# Patient Record
Sex: Female | Born: 1950 | Race: White | Hispanic: No | Marital: Married | Smoking: Never smoker
Health system: Southern US, Community
[De-identification: ages and names within clinical notes are randomized; demographics above are authoritative.]

## PROBLEM LIST (undated history)

## (undated) DIAGNOSIS — I10 Essential (primary) hypertension: Secondary | ICD-10-CM

## (undated) HISTORY — PX: OVARIAN CYST SURGERY: SHX726

---

## 2016-03-10 ENCOUNTER — Emergency Department (HOSPITAL_BASED_OUTPATIENT_CLINIC_OR_DEPARTMENT_OTHER): Payer: BLUE CROSS/BLUE SHIELD

## 2016-03-10 ENCOUNTER — Encounter (HOSPITAL_BASED_OUTPATIENT_CLINIC_OR_DEPARTMENT_OTHER): Payer: Self-pay

## 2016-03-10 ENCOUNTER — Emergency Department (HOSPITAL_BASED_OUTPATIENT_CLINIC_OR_DEPARTMENT_OTHER)
Admission: EM | Admit: 2016-03-10 | Discharge: 2016-03-10 | Disposition: A | Discharge status: Home / Self Care | Payer: BLUE CROSS/BLUE SHIELD | Attending: Emergency Medicine | Admitting: Emergency Medicine

## 2016-03-10 DIAGNOSIS — Z79899 Other long term (current) drug therapy: Secondary | ICD-10-CM | POA: Insufficient documentation

## 2016-03-10 DIAGNOSIS — R112 Nausea with vomiting, unspecified: Secondary | ICD-10-CM | POA: Diagnosis not present

## 2016-03-10 DIAGNOSIS — I10 Essential (primary) hypertension: Secondary | ICD-10-CM | POA: Insufficient documentation

## 2016-03-10 DIAGNOSIS — R1031 Right lower quadrant pain: Secondary | ICD-10-CM | POA: Insufficient documentation

## 2016-03-10 HISTORY — DX: Essential (primary) hypertension: I10

## 2016-03-10 LAB — COMPREHENSIVE METABOLIC PANEL
ALK PHOS: 59 U/L (ref 38–126)
ALT: 28 U/L (ref 14–54)
AST: 32 U/L (ref 15–41)
Albumin: 4.8 g/dL (ref 3.5–5.0)
Anion gap: 11 (ref 5–15)
BILIRUBIN TOTAL: 0.8 mg/dL (ref 0.3–1.2)
BUN: 10 mg/dL (ref 6–20)
CALCIUM: 9.7 mg/dL (ref 8.9–10.3)
CO2: 27 mmol/L (ref 22–32)
CREATININE: 0.81 mg/dL (ref 0.44–1.00)
Chloride: 100 mmol/L — ABNORMAL LOW (ref 101–111)
GFR calc non Af Amer: >60 mL/min (ref >60)
Glucose, Bld: 121 mg/dL — ABNORMAL HIGH (ref 65–99)
Potassium: 3.5 mmol/L (ref 3.5–5.1)
SODIUM: 138 mmol/L (ref 135–145)
TOTAL PROTEIN: 7.8 g/dL (ref 6.5–8.1)

## 2016-03-10 LAB — CBC WITH DIFFERENTIAL/PLATELET
BASOS PCT: 0 %
Basophils Absolute: 0 10*3/uL (ref 0.0–0.1)
EOS ABS: 0.2 10*3/uL (ref 0.0–0.7)
EOS PCT: 2 %
HCT: 43 % (ref 36.0–46.0)
Hemoglobin: 14.2 g/dL (ref 12.0–15.0)
LYMPHS ABS: 2.2 10*3/uL (ref 0.7–4.0)
Lymphocytes Relative: 24 %
MCH: 29.8 pg (ref 26.0–34.0)
MCHC: 33 g/dL (ref 30.0–36.0)
MCV: 90.1 fL (ref 78.0–100.0)
MONOS PCT: 7 %
Monocytes Absolute: 0.7 10*3/uL (ref 0.1–1.0)
Neutro Abs: 6 10*3/uL (ref 1.7–7.7)
Neutrophils Relative %: 67 %
PLATELETS: 221 10*3/uL (ref 150–400)
RBC: 4.77 MIL/uL (ref 3.87–5.11)
RDW: 14.4 % (ref 11.5–15.5)
WBC: 9.1 10*3/uL (ref 4.0–10.5)

## 2016-03-10 LAB — URINALYSIS, ROUTINE W REFLEX MICROSCOPIC
Bilirubin Urine: NEGATIVE
Glucose, UA: NEGATIVE mg/dL
Ketones, ur: NEGATIVE mg/dL
Leukocytes, UA: NEGATIVE
Nitrite: NEGATIVE
Protein, ur: NEGATIVE mg/dL
Specific Gravity, Urine: 1.004 — ABNORMAL LOW (ref 1.005–1.030)
pH: 6.5 (ref 5.0–8.0)

## 2016-03-10 LAB — URINE MICROSCOPIC-ADD ON

## 2016-03-10 LAB — LIPASE, BLOOD: Lipase: 22 U/L (ref 11–51)

## 2016-03-10 MED ORDER — GI COCKTAIL ~~LOC~~
30.0000 mL | Freq: Once | ORAL | Status: AC
  Administered 2016-03-10: 30 mL via ORAL
  Filled 2016-03-10: qty 30

## 2016-03-10 MED ORDER — ONDANSETRON HCL 4 MG PO TABS: 4.0000 mg | ORAL_TABLET | Freq: Four times a day (QID) | ORAL | 0 refills | Status: AC

## 2016-03-10 MED ORDER — DICYCLOMINE HCL 10 MG PO CAPS
10.0000 mg | ORAL_CAPSULE | Freq: Once | ORAL | Status: AC
Start: 2016-03-10 — End: 2016-03-10
  Administered 2016-03-10: 10 mg via ORAL
  Filled 2016-03-10: qty 1

## 2016-03-10 NOTE — ED Notes (Signed)
ED Provider at bedside. 

## 2016-03-10 NOTE — Discharge Instructions (Signed)

## 2016-03-10 NOTE — ED Provider Notes (Signed)
Emergency Department Provider Note  By signing my name below, I, Linna DarnerRussell Turner, attest that this documentation has been prepared under the direction and in the presence of physician practitioner, Maia PlanJoshua G Livianna Petraglia, MD. Electronically Signed: Linna Darnerussell Turner, Scribe. 03/10/2016. 10:12 PM.  I have reviewed the triage vital signs and the nursing notes.   HISTORY  Chief Complaint Abdominal Pain   HPI Comments: Belinda Mckinney is a 65 y.o. female who presents to the Emergency Department complaining of sudden onset, constant, waxing and waning, RLQ abdominal pain beginning last night. Pt describes her pain as a pressure-like sensation and notes it radiates across her lower abdomen. She states she was vomiting PTA due to the severity of her abdominal pain. She states the pain has "eased off" and is not unbearable currently. Pt reports she has not been eating as much since onset as a result of her abdominal pain. She states she has been passing gas and having normal bowel movements. She denies h/o abdominal surgery or kidney stone. 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 She notes she has a hiatal hernia. She denies CP, diarrhea, hematuria, or any other associated symptoms.   Past Medical History:  Diagnosis Date  . Hypertension     There are no active problems to display for this patient.   Past Surgical History:  Procedure Laterality Date  . OVARIAN CYST SURGERY      Current Outpatient Rx  . Order #: [id number] Class: Historical Med  . Order #: [id number] Class: Historical Med  . Order #: [id number] Class: Print    Allergies Patient has no known allergies.  No family history on file.  Social History Social History  Substance Use Topics  . Smoking status: Never Smoker  . Smokeless tobacco: Never Used  . Alcohol use No    Review of Systems  Constitutional: No  fever/chills Eyes: No visual changes. ENT: No sore throat. Cardiovascular: Denies chest pain. Respiratory: Denies shortness of breath. Gastrointestinal: Positive right sided lower abdominal pain. Positive nausea and vomiting. No diarrhea.  No constipation. Genitourinary: Negative for dysuria. Negative for hematuria.  Musculoskeletal: Negative for back pain. Skin: Negative for rash. Neurological: Negative for headaches, focal weakness or numbness.  10-point ROS otherwise negative.  ____________________________________________   PHYSICAL EXAM:  VITAL SIGNS: ED Triage Vitals  Enc Vitals Group     BP 03/10/16 1915 (!) 146/102     Pulse Rate 03/10/16 1915 88     Resp 03/10/16 1915 18     Temp 03/10/16 1915 97.7 F (36.5 C)     Temp Source 03/10/16 1915 Oral     SpO2 03/10/16 1915 100 %     Weight 03/10/16 1916 213 lb (96.6 kg)     Height 03/10/16 1916 5\' 6"  (1.676 m)     Pain Score 03/10/16 1913 7   Constitutional: Alert and oriented. Well appearing and in no acute distress. Eyes: Conjunctivae are normal.  Head: Atraumatic. Nose: No congestion/rhinnorhea. Mouth/Throat: Mucous membranes are moist.  Oropharynx non-erythematous. Neck: No stridor.  Cardiovascular: Normal rate, regular rhythm. Good peripheral circulation. Grossly normal heart sounds.   Respiratory: Normal respiratory effort.  No retractions. Lungs CTAB. Gastrointestinal: Soft with minimal tenderness to palpation along the RLQ. No distention.  Musculoskeletal: No lower extremity tenderness nor edema. No gross deformities of extremities. Neurologic:  Normal speech and language. No gross focal neurologic deficits are appreciated.  Skin:  Skin is warm, dry and intact. No rash noted.  ____________________________________________   LABS (all labs ordered are listed, but only abnormal results are displayed)  Labs Reviewed  URINALYSIS, ROUTINE W REFLEX MICROSCOPIC (NOT AT [facility]) - Abnormal; Notable for the following:        Result Value   Specific Gravity, Urine 1.004 (*)    Hgb urine dipstick LARGE (*)    All other components within normal limits  URINE MICROSCOPIC-ADD ON - Abnormal; Notable for the following:    Squamous Epithelial / LPF 0-5 (*)    Bacteria, UA RARE (*)    All other components within normal limits  COMPREHENSIVE METABOLIC PANEL - Abnormal; Notable for the following:    Chloride 100 (*)    Glucose, Bld 121 (*)    All other components within normal limits  LIPASE, BLOOD  CBC WITH DIFFERENTIAL/PLATELET   ____________________________________________  RADIOLOGY  Dg Abdomen Acute W/chest  Result Date: 03/10/2016 CLINICAL DATA:  RIGHT lower quadrant pressure.  Nausea and vomiting. EXAM: DG ABDOMEN ACUTE W/ 1V CHEST COMPARISON:  None. FINDINGS: Cardiomediastinal silhouette is normal. Lungs are clear, no pleural effusions. No pneumothorax. Soft tissue planes and included osseous structures are unremarkable. Bowel gas pattern is nondilated and nonobstructive. Single large bowel air-fluid level in the pelvis. No intra-abdominal mass effect, pathologic calcifications or free air. Soft tissue planes and included osseous structures are non-suspicious. Mild broad thoracolumbar dextroscoliosis on this nonweightbearing examination. IMPRESSION: No acute cardiopulmonary process. Single colonic air-fluid level, possible enteritis. Nonobstructive bowel gas pattern. Electronically Signed   By: Awilda Metro M.D.   On: 03/10/2016 22:44    ____________________________________________ DIAGNOSTIC STUDIES: Oxygen Saturation is 98% on RA, normal by my interpretation.    COORDINATION OF CARE: 10:17 PM Discussed treatment plan with pt at bedside and pt agreed to plan.  PROCEDURES  Procedure(s) performed:   Procedures  None ____________________________________________   INITIAL IMPRESSION / ASSESSMENT AND PLAN / ED COURSE  Pertinent labs & imaging results that were available during my care of  the patient were reviewed by me and considered in my medical decision making (see chart for details).  Patient with intermittent RLQ abdominal pain and bloating. Abdomen is minimally tender and improved slightly with passing gas. Continues to have BM. Lower clinical suspicion for appendicitis with minimal tenderness on exam. Will obtain plain film to assess bowel gas pattern. No report of blood in stool or fever to suggest infectious enteritis.   Labs unremarkable. Findings on plain from correlated well. Plan for conservative mgmt at home. No indication for abx. Will discharge with strong return precautions.   At this time, I do not feel there is any life-threatening condition present. I have reviewed and discussed all results (EKG, imaging, lab, urine as appropriate), exam findings with patient. I have reviewed nursing notes and appropriate previous records.  I feel the patient is safe to be discharged home without further emergent workup. Discussed usual and customary return precautions. Patient and family (if present) verbalize understanding and are comfortable with this plan.  Patient will follow-up with their primary care provider. If they do not have a primary care provider, information for follow-up has been provided to them. All questions have been answered.   ____________________________________________  FINAL CLINICAL IMPRESSION(S) / ED DIAGNOSES  Final diagnoses:  Right lower quadrant abdominal pain     MEDICATIONS GIVEN DURING THIS VISIT:  Medications  dicyclomine (BENTYL) capsule 10 mg (10 mg Oral Given 03/10/16 2221)  gi cocktail (Maalox,Lidocaine,Donnatal) (30 mLs  Oral Given 03/10/16 2221)     NEW OUTPATIENT MEDICATIONS STARTED DURING THIS VISIT:  Discharge Medication List as of 03/10/2016 11:08 PM    START taking these medications   Details  ondansetron (ZOFRAN) 4 MG tablet Take 1 tablet (4 mg total) by mouth every 6 (six) hours., Starting Wed 03/10/2016, Print         Note:  This document was prepared using Dragon voice recognition software and may include unintentional dictation errors.  Alona BeneJoshua Yasmyn Bellisario, MD Emergency Medicine  I personally performed the services described in this documentation, which was scribed in my presence. The recorded information has been reviewed and is accurate.       Maia PlanJoshua G Bowie Doiron, MD 03/11/16 [id number]

## 2016-03-10 NOTE — ED Notes (Signed)
Patient transported to X-ray 

## 2017-11-14 IMAGING — DX DG ABDOMEN ACUTE W/ 1V CHEST
3 series · 3 of 3 positions shown · non-contrast
Comparison: None.

CLINICAL DATA: RIGHT lower quadrant pressure.  Nausea and vomiting.

EXAM:
DG ABDOMEN ACUTE W/ 1V CHEST

[chest pa]
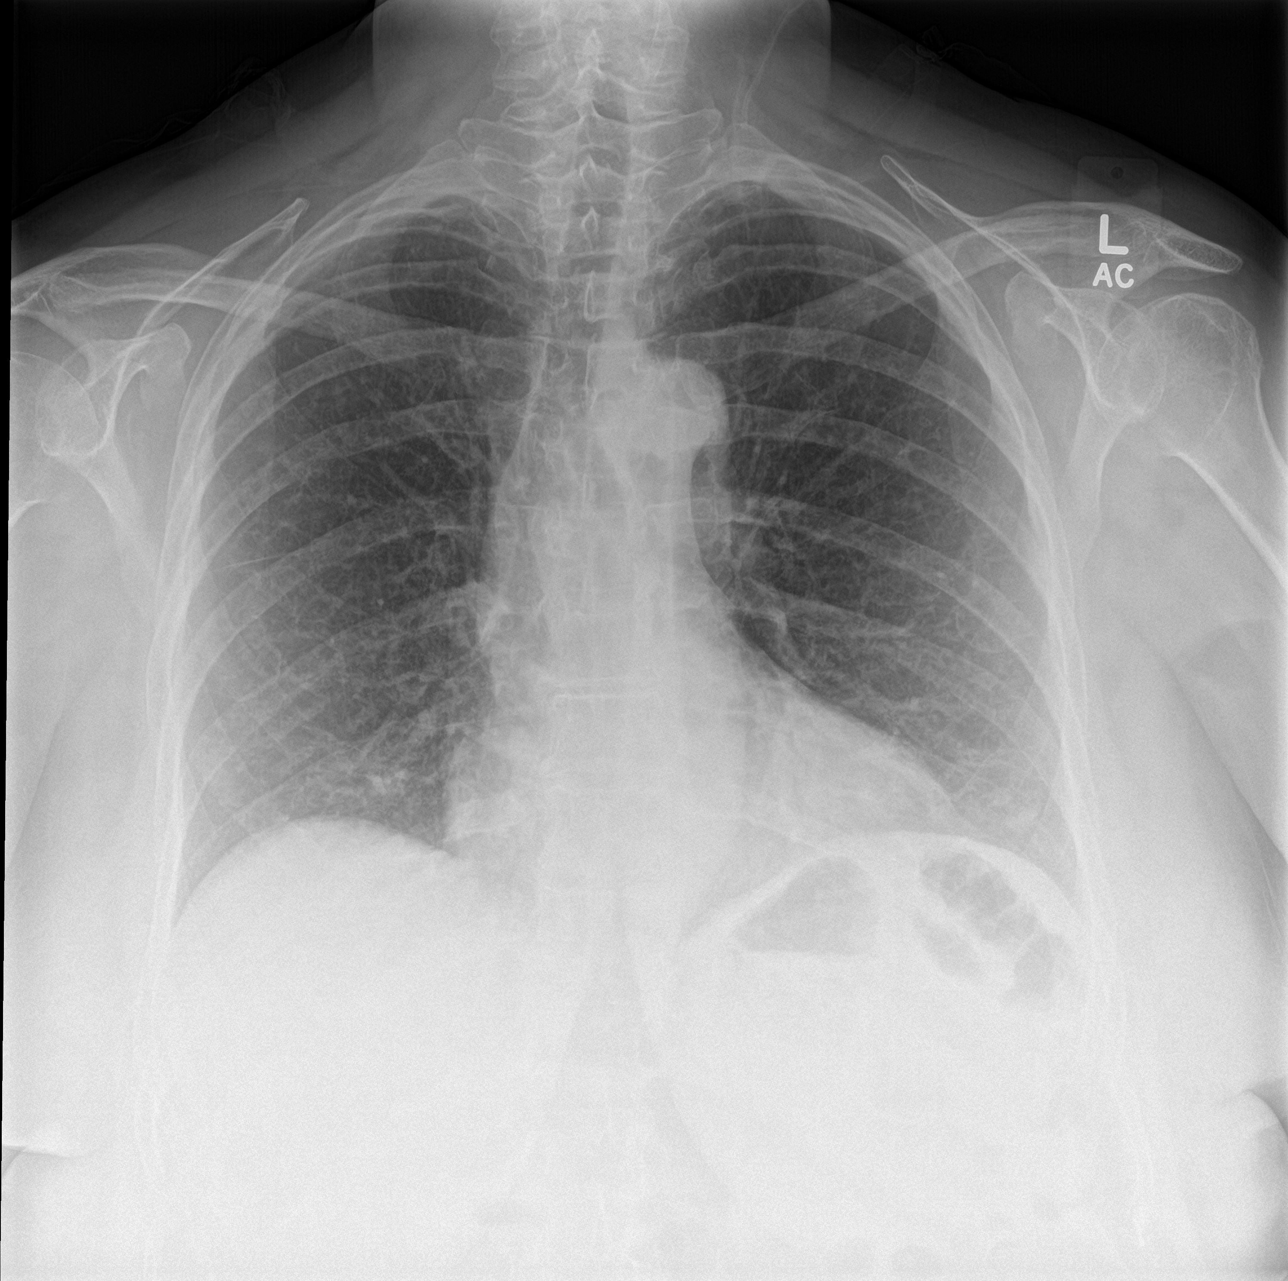

[abdomen erect]
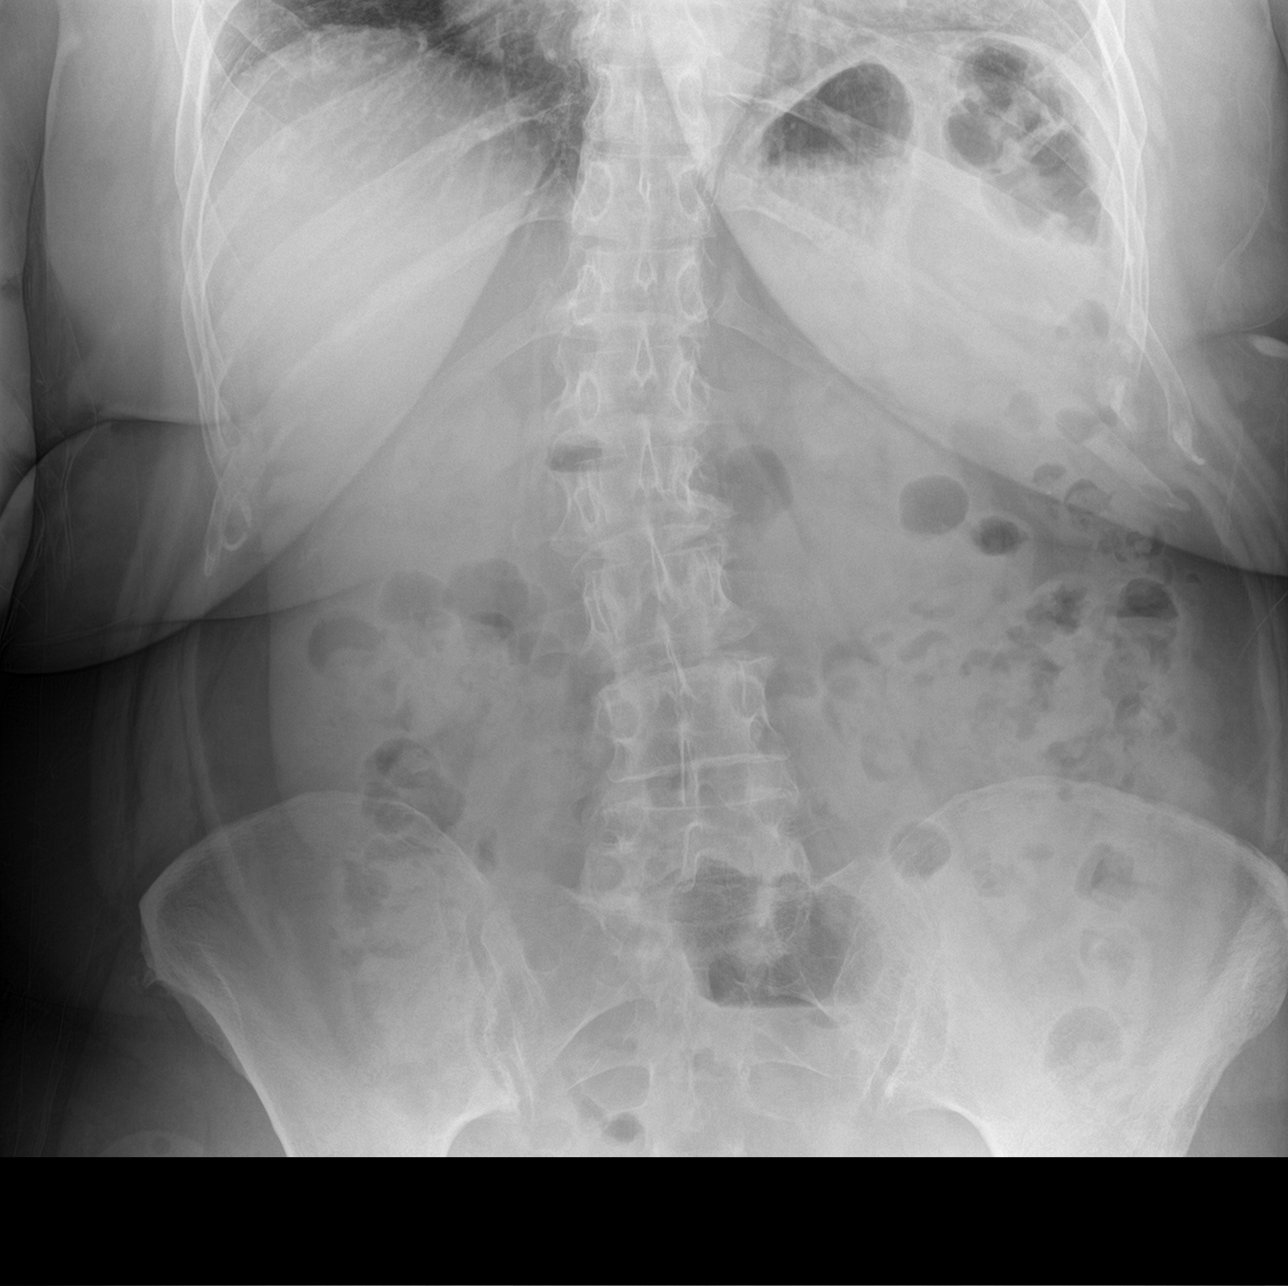

[abdomen supine]
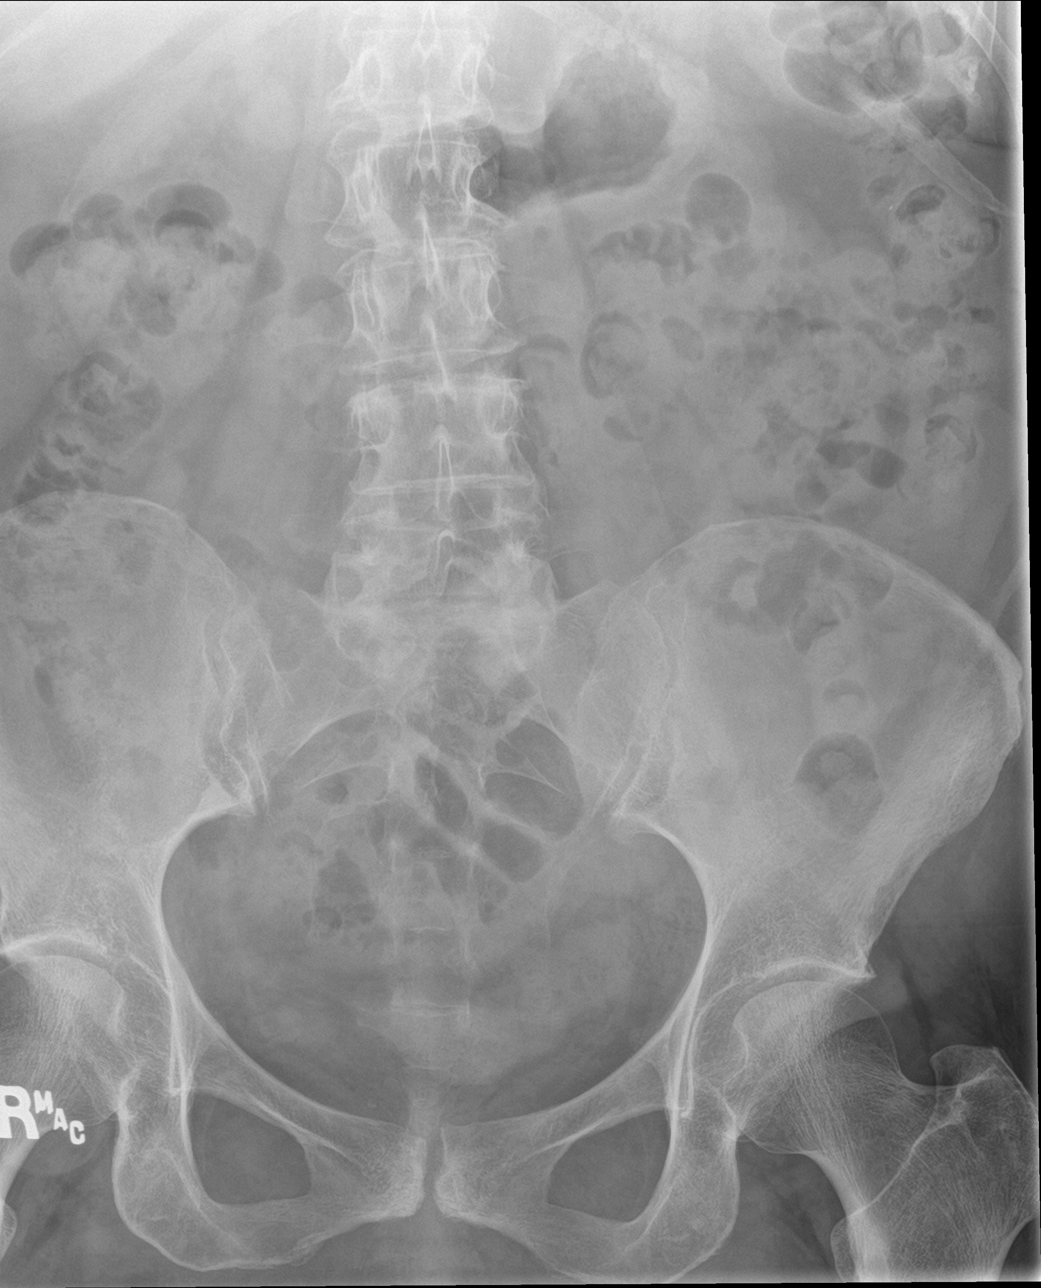

[3 of 3 positions shown; findings below may reference images not displayed]

FINDINGS: Cardiomediastinal silhouette is normal. Lungs are clear, no pleural
effusions. No pneumothorax. Soft tissue planes and included osseous
structures are unremarkable.

Bowel gas pattern is nondilated and nonobstructive. Single large
bowel air-fluid level in the pelvis. No intra-abdominal mass effect,
pathologic calcifications or free air. Soft tissue planes and
included osseous structures are non-suspicious. Mild broad
thoracolumbar dextroscoliosis on this nonweightbearing examination.
IMPRESSION: No acute cardiopulmonary process.

Single colonic air-fluid level, possible enteritis. Nonobstructive
bowel gas pattern.
